# Patient Record
Sex: Male | Born: 1970 | Race: White | Hispanic: No | Marital: Married
Health system: Southern US, Community
[De-identification: ages and names within clinical notes are randomized; demographics above are authoritative.]

---

## 2003-02-21 ENCOUNTER — Encounter: Payer: Self-pay | Admitting: Family Medicine

## 2003-02-21 ENCOUNTER — Encounter: Admission: RE | Admit: 2003-02-21 | Discharge: 2003-02-21 | Payer: Self-pay | Admitting: Family Medicine

## 2006-07-31 ENCOUNTER — Emergency Department (HOSPITAL_COMMUNITY): Admission: EM | Admit: 2006-07-31 | Discharge: 2006-07-31 | Payer: Self-pay | Admitting: Emergency Medicine

## 2011-03-03 ENCOUNTER — Ambulatory Visit
Admission: RE | Admit: 2011-03-03 | Discharge: 2011-03-03 | Disposition: A | Discharge status: Home / Self Care | Payer: Worker's Compensation | Source: Ambulatory Visit | Attending: Occupational Medicine | Admitting: Occupational Medicine

## 2011-03-03 ENCOUNTER — Other Ambulatory Visit: Payer: Self-pay | Admitting: Occupational Medicine

## 2011-03-03 DIAGNOSIS — R252 Cramp and spasm: Secondary | ICD-10-CM

## 2017-09-08 ENCOUNTER — Emergency Department (HOSPITAL_COMMUNITY): Payer: Worker's Compensation

## 2017-09-08 ENCOUNTER — Emergency Department (HOSPITAL_COMMUNITY)
Admission: EM | Admit: 2017-09-08 | Discharge: 2017-09-08 | Disposition: A | Discharge status: Home / Self Care | Payer: Worker's Compensation | Attending: Emergency Medicine | Admitting: Emergency Medicine

## 2017-09-08 ENCOUNTER — Encounter (HOSPITAL_COMMUNITY): Payer: Self-pay

## 2017-09-08 DIAGNOSIS — G43A0 Cyclical vomiting, not intractable: Secondary | ICD-10-CM | POA: Insufficient documentation

## 2017-09-08 DIAGNOSIS — R079 Chest pain, unspecified: Secondary | ICD-10-CM

## 2017-09-08 DIAGNOSIS — Z79899 Other long term (current) drug therapy: Secondary | ICD-10-CM | POA: Insufficient documentation

## 2017-09-08 DIAGNOSIS — R1115 Cyclical vomiting syndrome unrelated to migraine: Secondary | ICD-10-CM

## 2017-09-08 LAB — COMPREHENSIVE METABOLIC PANEL
ALBUMIN: 4.3 g/dL (ref 3.5–5.0)
ALK PHOS: 58 U/L (ref 38–126)
ALT: 23 U/L (ref 17–63)
ANION GAP: 10 (ref 5–15)
AST: 37 U/L (ref 15–41)
BILIRUBIN TOTAL: 1.5 mg/dL — AB (ref 0.3–1.2)
BUN: 7 mg/dL (ref 6–20)
CALCIUM: 9.4 mg/dL (ref 8.9–10.3)
CHLORIDE: 104 mmol/L (ref 101–111)
CO2: 22 mmol/L (ref 22–32)
CREATININE: 0.8 mg/dL (ref 0.61–1.24)
GFR calc Af Amer: >60 mL/min (ref >60)
GFR calc non Af Amer: >60 mL/min (ref >60)
Glucose, Bld: 104 mg/dL — ABNORMAL HIGH (ref 65–99)
Potassium: 4.2 mmol/L (ref 3.5–5.1)
Sodium: 136 mmol/L (ref 135–145)
Total Protein: 6.9 g/dL (ref 6.5–8.1)

## 2017-09-08 LAB — CBC
HCT: 44.1 % (ref 39.0–52.0)
Hemoglobin: 15.6 g/dL (ref 13.0–17.0)
MCH: 31.7 pg (ref 26.0–34.0)
MCHC: 35.4 g/dL (ref 30.0–36.0)
MCV: 89.6 fL (ref 78.0–100.0)
PLATELETS: 255 10*3/uL (ref 150–400)
RBC: 4.92 MIL/uL (ref 4.22–5.81)
RDW: 12.8 % (ref 11.5–15.5)
WBC: 13 10*3/uL — AB (ref 4.0–10.5)

## 2017-09-08 LAB — LIPASE, BLOOD: Lipase: 30 U/L (ref 11–51)

## 2017-09-08 LAB — I-STAT TROPONIN, ED: TROPONIN I, POC: 0 ng/mL (ref 0.00–0.08)

## 2017-09-08 NOTE — ED Triage Notes (Signed)
Pt presents for evaluation of nausea/vomiting with central chest pressure and pain. States hx of chronic vomiting, supposed to take amitriptyline daily but has weaned self off, started vomiting over past 12 hours consistently. States took cialis prior to event.

## 2017-09-08 NOTE — ED Provider Notes (Signed)
[facility] EMERGENCY DEPARTMENT Provider Note   CSN: [id number] Arrival date & time: 09/08/17  1406     History   Chief Complaint Chief Complaint  Patient presents with  . Emesis  . Chest Pain    HPI Barry Scott is a 46 y.o. male with history of chronic vomiting syndrome who presents with resolved emesis and chest pressure.  Patient reports he has a rare disorder, chronic vomiting syndrome, for which she was taking amitriptyline.  He recently weaned himself off, because he was having less episodes of cycling.  He reports that he stayed up all night last night working and ate breakfast around 8 AM.  Patient then took a Cialis prior to having intercourse with his significant other and shortly following, began feeling queasy and sweating and lightheaded.  He then began vomiting.  Following that, he began having a chest pressure.  It hurt at the time to take a deep breath.  He reports he was feeling anxious about was happening and feels it may have been related to anxiety.  He vomited around 6 times.  It felt similar to his past episodes related to his chronic vomiting syndrome.  He is back to baseline now.  He denies chest pain or shortness of breath at this time.  He denies any recent long trips, surgeries, cancer, history of blood clots, new leg pain or swelling.  He did not take any other medications prior to arrival.  Patient has been able to tolerate fluids, but has not tried to eat since 8 AM this morning.  HPI  History reviewed. No pertinent past medical history.  There are no active problems to display for this patient.   History reviewed. No pertinent surgical history.     Home Medications    Prior to Admission medications   Medication Sig Start Date End Date Taking? Authorizing Provider  ALCLOMETASONE DIPROPIONATE EX Apply 1 application topically as needed. Nose, cheek, forehead   Yes [provider]  amitriptyline (ELAVIL) 25 MG tablet Take  25 mg by mouth daily.   Yes [provider]    Family History No family history on file.  Social History Social History  Substance Use Topics  . Smoking status: Not on file  . Smokeless tobacco: Not on file  . Alcohol use Not on file     Allergies   Penicillins   Review of Systems Review of Systems  Constitutional: Negative for chills and fever.  HENT: Negative for facial swelling and sore throat.   Respiratory: Positive for shortness of breath.   Cardiovascular: Positive for chest pain.  Gastrointestinal: Positive for nausea and vomiting. Negative for abdominal pain.  Genitourinary: Negative for dysuria.  Musculoskeletal: Negative for back pain.  Skin: Negative for rash and wound.  Neurological: Negative for headaches.  Psychiatric/Behavioral: The patient is not nervous/anxious.      Physical Exam Updated Vital Signs BP (!) 134/93   Pulse 78   Temp 98.2 F (36.8 C) (Oral)   Resp 16   SpO2 99%   Physical Exam  Constitutional: He appears well-developed and well-nourished. No distress.  HENT:  Head: Normocephalic and atraumatic.  Mouth/Throat: Oropharynx is clear and moist. No oropharyngeal exudate.  Eyes: Pupils are equal, round, and reactive to light. Conjunctivae are normal. Right eye exhibits no discharge. Left eye exhibits no discharge. No scleral icterus.  Neck: Normal range of motion. Neck supple. No thyromegaly present.  Cardiovascular: Normal rate, regular rhythm, normal heart sounds and intact  distal pulses.  Exam reveals no gallop and no friction rub.   No murmur heard. Pulmonary/Chest: Effort normal and breath sounds normal. No stridor. No respiratory distress. He has no wheezes. He has no rales.  Abdominal: Soft. Bowel sounds are normal. He exhibits no distension. There is no tenderness. There is no rebound and no guarding.  Musculoskeletal: He exhibits no edema.  Lymphadenopathy:    He has no cervical adenopathy.  Neurological: He is  alert. Coordination normal.  Skin: Skin is warm and dry. No rash noted. He is not diaphoretic. No pallor.  Psychiatric: He has a normal mood and affect.  Nursing note and vitals reviewed.    ED Treatments / Results  Labs (all labs ordered are listed, but only abnormal results are displayed) Labs Reviewed  COMPREHENSIVE METABOLIC PANEL - Abnormal; Notable for the following:       Result Value   Glucose, Bld 104 (*)    Total Bilirubin 1.5 (*)    All other components within normal limits  CBC - Abnormal; Notable for the following:    WBC 13.0 (*)    All other components within normal limits  LIPASE, BLOOD  I-STAT TROPONIN, ED    EKG  EKG Interpretation  Date/Time:  Wednesday September 08 2017 14:22:33 EDT Ventricular Rate:  93 PR Interval:  128 QRS Duration: 94 QT Interval:  372 QTC Calculation: 462 R Axis:   69 Text Interpretation:  Normal sinus rhythm with sinus arrhythmia Possible Left atrial enlargement Incomplete right bundle branch block Borderline ECG No acute changes No old tracing to compare Confirmed by Derwood Kaplan [phone]) on 09/08/2017 8:05:42 PM       Radiology Dg Chest 2 View  Result Date: 09/08/2017 CLINICAL DATA:  Chronic vomiting syndrome. EXAM: CHEST  2 VIEW COMPARISON:  None in PACs FINDINGS: The lungs are well-expanded and clear. The heart and pulmonary vascularity are normal. There is no large pleural effusion. The posterior costophrenic angle tips are excluded from the study. The mediastinum is normal in width. The gas pattern in the upper abdomen is unremarkable. The bony structures are also unremarkable. IMPRESSION: There is no active cardiopulmonary disease. Electronically Signed   By: David  [place] M.D.   On: 09/08/2017 14:47    Procedures Procedures (including critical care time)  Medications Ordered in ED Medications - No data to display   Initial Impression / Assessment and Plan / ED Course  I have reviewed the triage vital signs and the  nursing notes.  Pertinent labs & imaging results that were available during my care of the patient were reviewed by me and considered in my medical decision making (see chart for details).     Patient with probable episode of cyclical vomiting.  He is back to baseline and tolerating fluids.  He is no longer having any chest pain.  Labs are unremarkable except for mild elevation in WBC, 13 and total bilirubin, 1.5.  Troponin is negative.  Patient refused repeat troponin, as he states he is feeling better and passes out with giving blood.  He understands the risks of missing any development of cardiac issue.  However, I feel ACS is very unlikely.  EKG shows NSR, no acute changes.  Patient advised to follow-up with PCP and discuss reinitiating amitriptyline for prophylaxis.  Return precautions discussed.  Patient understands and agrees with plan.  Patient vitals stable throughout ED course and discharged in satisfactory condition.  I discussed patient case with Dr. Rhunette Croft who agrees with plan.  Final Clinical Impressions(s) / ED Diagnoses   Final diagnoses:  Non-intractable cyclical vomiting with nausea  Chest pain, unspecified type    New Prescriptions New Prescriptions   No medications on file     Verdis PrimeLaw, Daris Harkins M, PA-C 09/08/17 2109    Derwood KaplanNanavati, Ankit, MD 09/09/17 [phone]

## 2017-09-08 NOTE — Discharge Instructions (Signed)
Please speak with your doctor about restarting your amitriptyline.  Please return to the emergency department if you develop any new or worsening symptoms.

## 2017-09-08 NOTE — ED Notes (Signed)
Unable to draw blood off of IV. Patient refuses to get stuck again.  Refuses final I-stat troponin.  EDP made aware.

## 2017-09-08 NOTE — ED Notes (Signed)
Patient denies chest pressure/pain at this time.  See ED triage note.

## 2018-09-11 IMAGING — CR DG CHEST 2V
4 series · 4 of 4 positions shown · non-contrast
Comparison: None in PACs

CLINICAL DATA: Chronic vomiting syndrome.

EXAM:
CHEST  2 VIEW

[chest lat (1 of 2)]
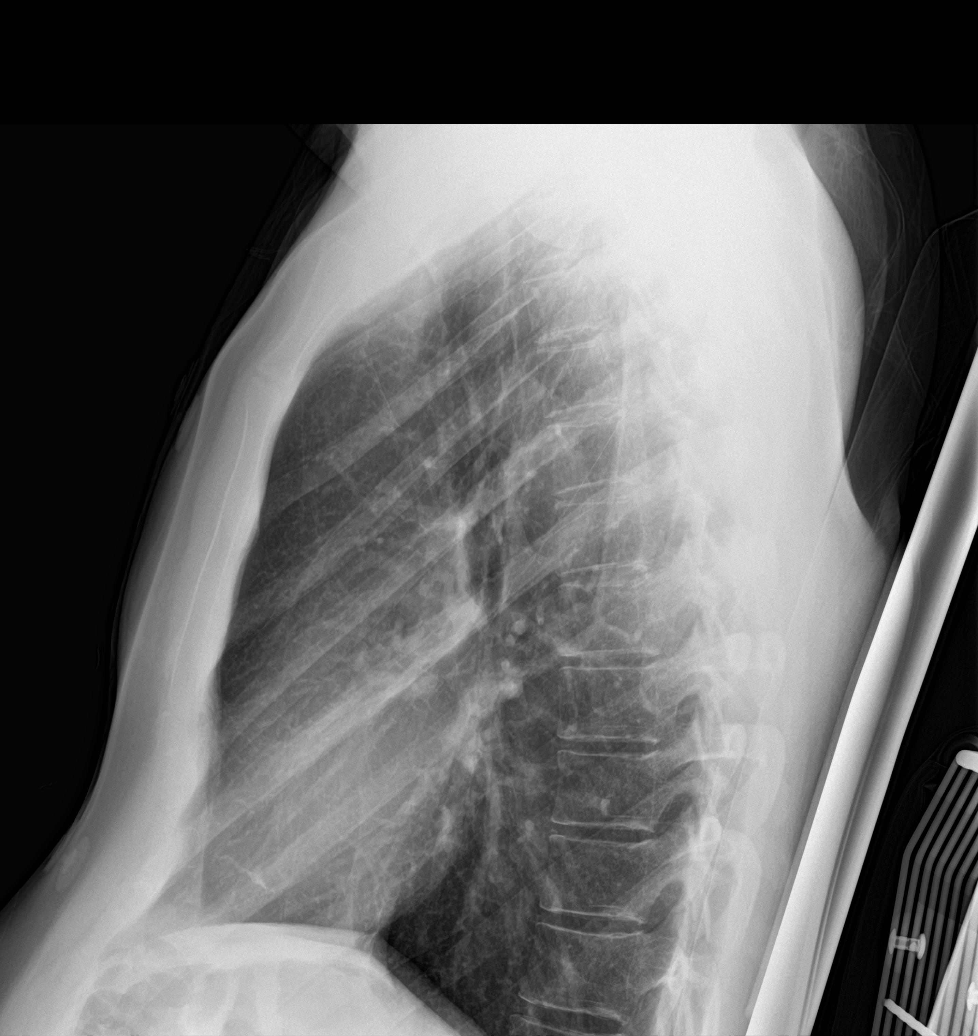

[chest ap (1 of 2)]
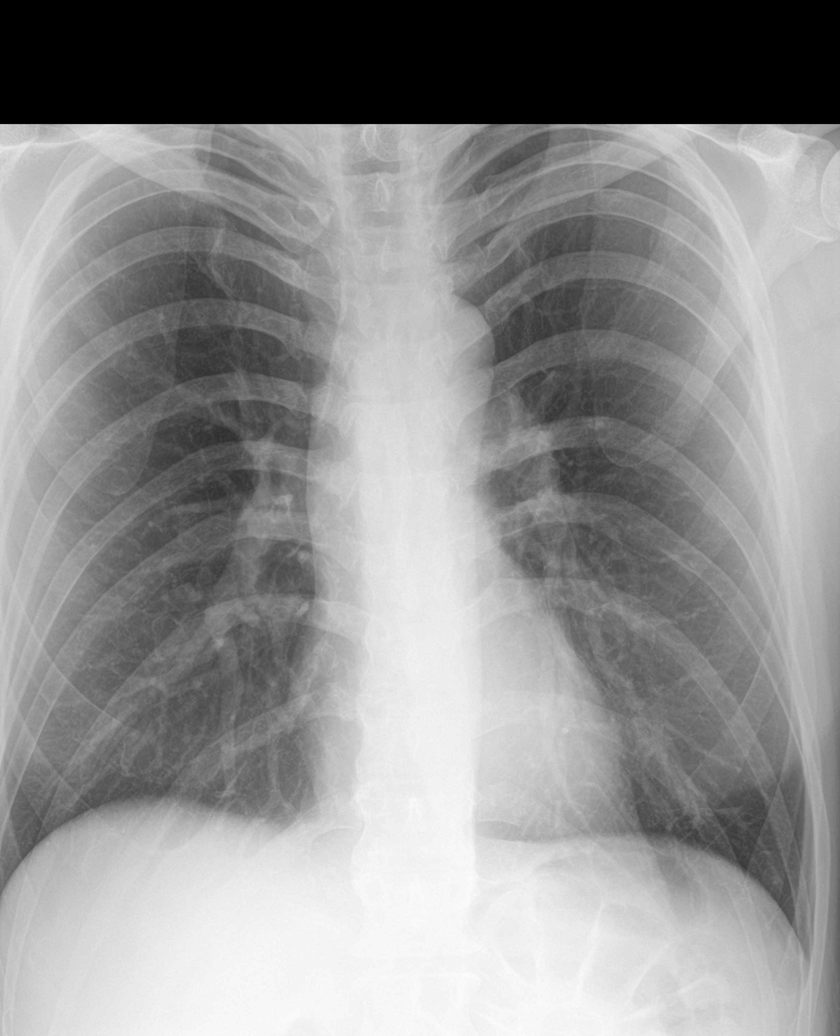

[chest lat (2 of 2)]
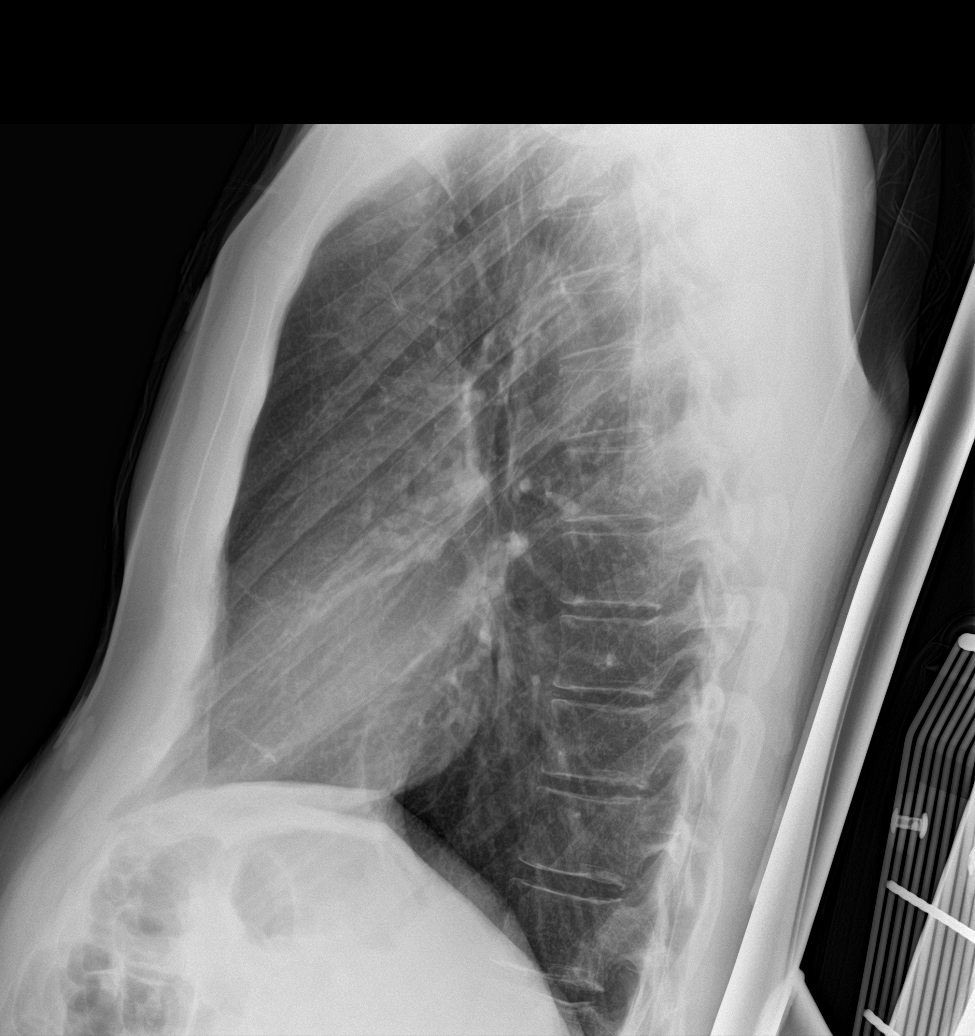

[chest ap (2 of 2)]
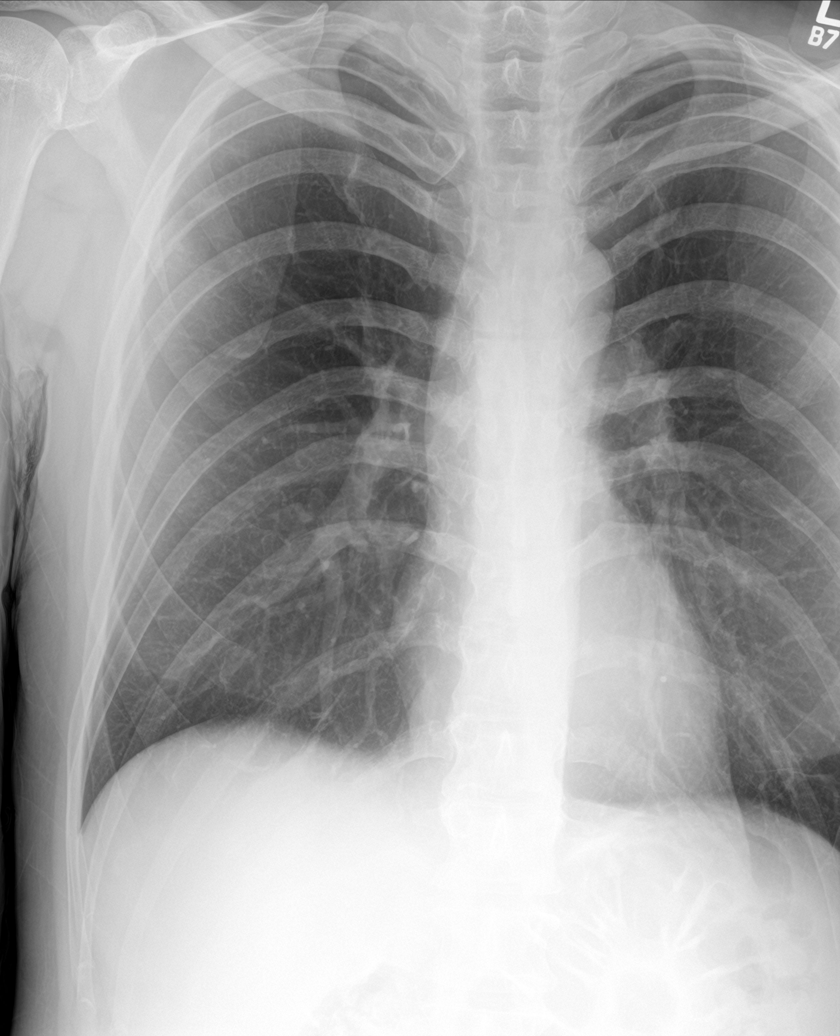

[4 of 4 positions shown; findings below may reference images not displayed]

FINDINGS: The lungs are well-expanded and clear. The heart and pulmonary
vascularity are normal. There is no large pleural effusion. The
posterior costophrenic angle tips are excluded from the study. The
mediastinum is normal in width. The gas pattern in the upper abdomen
is unremarkable. The bony structures are also unremarkable.
IMPRESSION: There is no active cardiopulmonary disease.
